# Patient Record
Sex: Female | Born: 1965 | Hispanic: No | Smoking: Never smoker
Health system: Southern US, Community
[De-identification: ages and names within clinical notes are randomized; demographics above are authoritative.]

---

## 2004-10-18 ENCOUNTER — Other Ambulatory Visit: Admission: RE | Admit: 2004-10-18 | Discharge: 2004-10-18 | Payer: Self-pay | Admitting: Obstetrics and Gynecology

## 2013-06-24 LAB — HM COLONOSCOPY

## 2014-07-03 ENCOUNTER — Ambulatory Visit (INDEPENDENT_AMBULATORY_CARE_PROVIDER_SITE_OTHER): Payer: 59 | Admitting: Medical

## 2014-07-03 ENCOUNTER — Encounter: Payer: Self-pay | Admitting: Medical

## 2014-07-03 VITALS — BP 104/60 | HR 72 | Temp 98.0°F | Resp 16 | Wt 178.0 lb

## 2014-07-03 DIAGNOSIS — J069 Acute upper respiratory infection, unspecified: Secondary | ICD-10-CM

## 2014-07-03 DIAGNOSIS — H9202 Otalgia, left ear: Secondary | ICD-10-CM

## 2014-07-03 DIAGNOSIS — H6500 Acute serous otitis media, unspecified ear: Secondary | ICD-10-CM

## 2014-07-03 MED ORDER — AZITHROMYCIN 250 MG PO TABS: ORAL_TABLET | ORAL | Status: DC

## 2014-07-03 NOTE — Progress Notes (Signed)
Subjective:  Yolanda Wilkinson is a 49 y.o. female who presents for possible sinus infection.  Symptoms include about a week hx/o head congestion, some sinus pressure, cough, fluid in ears, scratchy throat.  always starts with sore throat, and then goes to head.  Denies fever, NVD, teeth pain, wheezing, SOB.  Patient is a non-smoker.  Using allegra D for symptoms.  +lots of sick contacts.  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  She notes similar thing a year ago.  No other aggravating or relieving factors.  No other c/o.  ROS as in subjective   Objective: Filed Vitals:   07/03/14 0925  BP: 104/60  Pulse: 72  Temp: 98 F (36.7 C)  Resp: 16    General appearance: Alert, WD/WN, no distress                             Skin: warm, no rash                           Head: no sinus tenderness,                            Eyes: conjunctiva normal, corneas clear, PERRLA                            Ears: left TM with serous fluid and erythema, right tm normal,  external ear canals normal                          Nose: septum midline, turbinates swollen, with erythema and clear discharge             Mouth/throat: MMM, tongue normal, mild pharyngeal erythema                           Neck: supple, no adenopathy, no thyromegaly, nontender                          Heart: RRR, normal S1, S2, no murmurs                         Lungs: CTA bilaterally, no wheezes, rales, or rhonchi      Assessment and Plan:  Encounter Diagnoses  Name Primary?  . Acute upper respiratory infection Yes  . Otalgia of left ear   . Acute serous otitis media, recurrence not specified, unspecified laterality    Discussed exam findings, concerns, treatment recommendations.  Prescription given for Zpak.      Specific recommendations today include:  Continue Allegra D   Increase water intake  Rest  Take OTC Ibuprofen, 3 tablets up to 3 time daily for pain  Consider nasal saline flush  Begin Zpak for ear  infection  If worse or not improving within the next few days, then let me know  Consider returning at your convenience for a routine physical

## 2014-07-03 NOTE — Patient Instructions (Signed)
  Thank you for giving me the opportunity to serve you today.    Your diagnosis today includes: Encounter Diagnoses  Name Primary?  . Acute upper respiratory infection Yes  . Otalgia of left ear   . Acute serous otitis media, recurrence not specified, unspecified laterality      Specific recommendations today include:  Continue Allegra D   Increase water intake  Rest  Take OTC Ibuprofen, 3 tablets up to 3 time daily for pain  Consider nasal saline flush  Begin Zpak for ear infection  If worse or not improving within the next few days, then let me know  Consider returning at your convenience for a routine physical

## 2014-07-08 ENCOUNTER — Ambulatory Visit: Payer: Self-pay | Admitting: Medical

## 2014-12-23 ENCOUNTER — Ambulatory Visit (INDEPENDENT_AMBULATORY_CARE_PROVIDER_SITE_OTHER): Payer: 59 | Admitting: Medical

## 2014-12-23 ENCOUNTER — Encounter: Payer: Self-pay | Admitting: Medical

## 2014-12-23 VITALS — BP 120/78 | HR 72 | Temp 98.1°F | Wt 187.0 lb

## 2014-12-23 DIAGNOSIS — G4710 Hypersomnia, unspecified: Secondary | ICD-10-CM | POA: Diagnosis not present

## 2014-12-23 DIAGNOSIS — Z8489 Family history of other specified conditions: Secondary | ICD-10-CM | POA: Diagnosis not present

## 2014-12-23 DIAGNOSIS — R5383 Other fatigue: Secondary | ICD-10-CM | POA: Diagnosis not present

## 2014-12-23 DIAGNOSIS — G478 Other sleep disorders: Secondary | ICD-10-CM

## 2014-12-23 DIAGNOSIS — Z820 Family history of epilepsy and other diseases of the nervous system: Secondary | ICD-10-CM

## 2014-12-23 DIAGNOSIS — Z8349 Family history of other endocrine, nutritional and metabolic diseases: Secondary | ICD-10-CM | POA: Diagnosis not present

## 2014-12-23 DIAGNOSIS — R0683 Snoring: Secondary | ICD-10-CM | POA: Diagnosis not present

## 2014-12-23 DIAGNOSIS — R400 Somnolence: Secondary | ICD-10-CM

## 2014-12-23 LAB — COMPREHENSIVE METABOLIC PANEL
ALK PHOS: 52 U/L (ref 39–117)
ALT: 17 U/L (ref 0–35)
AST: 16 U/L (ref 0–37)
Albumin: 4.1 g/dL (ref 3.5–5.2)
BILIRUBIN TOTAL: 0.4 mg/dL (ref 0.2–1.2)
BUN: 10 mg/dL (ref 6–23)
CALCIUM: 9 mg/dL (ref 8.4–10.5)
CO2: 24 mEq/L (ref 19–32)
CREATININE: 0.83 mg/dL (ref 0.50–1.10)
Chloride: 105 mEq/L (ref 96–112)
Glucose, Bld: 74 mg/dL (ref 70–99)
Potassium: 4.5 mEq/L (ref 3.5–5.3)
Sodium: 138 mEq/L (ref 135–145)
Total Protein: 7 g/dL (ref 6.0–8.3)

## 2014-12-23 LAB — CBC
HEMATOCRIT: 39.4 % (ref 36.0–46.0)
HEMOGLOBIN: 13.1 g/dL (ref 12.0–15.0)
MCH: 28.9 pg (ref 26.0–34.0)
MCHC: 33.2 g/dL (ref 30.0–36.0)
MCV: 87 fL (ref 78.0–100.0)
MPV: 9.4 fL (ref 8.6–12.4)
PLATELETS: 343 10*3/uL (ref 150–400)
RBC: 4.53 MIL/uL (ref 3.87–5.11)
RDW: 14.2 % (ref 11.5–15.5)
WBC: 9.6 10*3/uL (ref 4.0–10.5)

## 2014-12-23 LAB — TSH: TSH: 1.202 u[IU]/mL (ref 0.350–4.500)

## 2014-12-23 LAB — T4, FREE: Free T4: 0.94 ng/dL (ref 0.80–1.80)

## 2014-12-23 NOTE — Progress Notes (Signed)
Subjective Here for not feeling herself.  Feels exhausted all the time, even after good night sleep.   Feels edgy all the time.   Mom and sister both have thyroid issues, feel like she needs to be checked out.  Been feeling this way gradually over the last 3 months.  Has been on low dose estrogen per gynecology since last August at which time she complained of some depressed mood and anxiety.    Was on prozac and treated for a little less than a year in remote past when her kids were young.  Looking back she thinks she didn't know how to cope will feelings then, but now does.  Doesn't think depression is the main cause of her symptoms currently but does feel depressed in mood, but feels somewhat guilty for not doing housework when she feels so fatigued.    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   LMP last month, but was off a little when she started the estrogen.    Periods were very painful prior.  Was not having decrease or absence of periods leading to starting estrogen this past year. No mention by gyn of menopausal.  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   She is also back in school to complete degree in music.  Has had some weight gain.   She does snore.  No witnessed apnea.     Mom and sister both have thyroid disease and had similar symptom when they were diagnosed.   Her mom and son both have sleep apnea, on CPAP.   She is exercising, eating healthy.   No other aggravating or relieving factors. No other complaint.  ROS as in subjective   Objective: BP 120/78 mmHg  Pulse 72  Temp(Src) 98.1 F (36.7 C) (Oral)  Wt 187 lb (84.823 kg)   General appearance: alert, no distress, WD/WN, white female HEENT: normocephalic, sclerae anicteric, PERRLA, EOMi, nares patent, no discharge or erythema, pharynx normal Oral cavity: MMM, no lesions Neck: supple, no lymphadenopathy, no thyromegaly, no masses Heart: RRR, normal S1, S2, no murmurs Lungs:  CTA bilaterally, no wheezes, rhonchi, or rales Extremities: no edema, no cyanosis, no clubbing Pulses: 2+ symmetric, upper and lower extremities, normal cap refill Neurological: alert, oriented x 3, CN2-12 intact, strength normal upper extremities and lower extremities, sensation normal throughout, DTRs 2+ throughout, no cerebellar signs, gait normal Psychiatric: normal affect, tearful at times, but otherwise behavior normal, pleasant    Assessment: Encounter Diagnoses  Name Primary?  . Other fatigue Yes  . Family history of thyroid disease   . Family history of sleep apnea   . Snoring   . Non-restorative sleep   . Daytime somnolence     Plan: Discussed possible causes of her symptoms, depression, perimenopause, sleep apnea, other.    Epworth sleep scale score of 7.   Neck circumference of 14".   PHQ9 score of 17  Discussed eval to include labs today, consider sleep study, consider counseling, consider depression as cause too.  F/u  pending labs.

## 2016-01-11 ENCOUNTER — Encounter: Payer: Self-pay | Admitting: Medical

## 2016-01-11 ENCOUNTER — Ambulatory Visit (INDEPENDENT_AMBULATORY_CARE_PROVIDER_SITE_OTHER): Payer: BLUE CROSS/BLUE SHIELD | Admitting: Medical

## 2016-01-11 ENCOUNTER — Ambulatory Visit
Admission: RE | Admit: 2016-01-11 | Discharge: 2016-01-11 | Disposition: A | Discharge status: Home / Self Care | Payer: Self-pay | Source: Ambulatory Visit | Attending: Medical | Admitting: Medical

## 2016-01-11 VITALS — BP 118/70 | HR 63 | Wt 170.0 lb

## 2016-01-11 DIAGNOSIS — M25562 Pain in left knee: Secondary | ICD-10-CM

## 2016-01-11 DIAGNOSIS — M7521 Bicipital tendinitis, right shoulder: Secondary | ICD-10-CM

## 2016-01-11 DIAGNOSIS — M7551 Bursitis of right shoulder: Secondary | ICD-10-CM

## 2016-01-11 DIAGNOSIS — M25511 Pain in right shoulder: Secondary | ICD-10-CM | POA: Diagnosis not present

## 2016-01-11 MED ORDER — NAPROXEN 500 MG PO TABS: 500.0000 mg | ORAL_TABLET | Freq: Two times a day (BID) | ORAL | 0 refills | Status: DC

## 2016-01-11 NOTE — Progress Notes (Signed)
Subjective:     Patient ID: Yolanda Wilkinson, female   DOB: Nov 16, 1965, 50 y.o.   MRN: [id number]  HPI  Chief Complaint  Patient presents with  . Other    hurt lt knee last week. having trouble walking. also having rt shoulder pain said that it is just a nagging pain   Here for left knee pain, right shoulder pain.  Started going to gym in January 2017, been doing boot camp class and groups weight lifting class.  goes to [employer].  Over the last several months has had ongoing right shoulder and left knee pain.   However, here today for worsening left knee pain.   Right knee is ok.   Thinks she over worked the left knee last week.  Last week did weigh lifting group class.  Last week had wet feet, walked into her bathroom on tile floor slipped and twisted knee some thing.   Left knee hurts worse in recent days.  Has had some intermittent left knee swelling, but none currently.  Knee hurts currently.   No numbness or tingling in the leg.   No numbness, weakness or tingling in arms.   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  Is right handed.   Neck hurts some too.   Uses Aleve some, ice some.   No other injury.   No other aggravating or relieving factors. No other complaint.  Review of Systems     Objective:   Physical Exam  Gen: wd, wn nad Skin : unremarkable, no erythema or bruising of knee or shoulder Neck nontender with normal ROM Left knee with mild tenderness medial joint line, mild pain with knee flexed about 45 degrees.  otherwise left knee without swelling, normal ROM, nontender otherwise, no laxity.  Rest of leg exam unremarkable bilat Right shoulder with tenderness over right biceps origin and lateral deltoid.  Otherwise non tender.  Pain with right shoulder ROM over 90 degrees.   Pain in right shoulder with neers.  Otherwise no laxity, nontender otherwise.  Rest of bilat arm exam nontender, normal ROM, no deformity. Ext: no  edema Arms and legs neurovascularly intact     Assessment:     Encounter Diagnoses  Name Primary?  . Left knee pain Yes  . Right shoulder pain   . Biceps tendonitis, right   . Shoulder bursitis, right        Plan:     Left knee pain - possibly just inflammation from overuse.   Exam mostly noncontributory.  will send for xray.    advised no deep knee bends, lunges or other significant leg exercise currently, give some time of relative rest this week, can ice 20 minutes per time, begin Naprosyn prescription, and will f/u pending xray  Shoulder pain - discussed exam and findings suggestive of bursitis of shoulder and biceps tendonitis.   Similarly to knee, advised a week of relative rest, no overhead lifting this week, ice with bag of frozen peas per time, naprosyn, and if not improving, consider PT, ortho referral or can return with Dr. Susann Givens for biceps tendonitis injection.    Yolanda Wilkinson was seen today for other.  Diagnoses and all orders for this visit:  Left knee pain -     DG Knee Complete 4 Views Left; Future  Right shoulder pain  Biceps tendonitis, right  Shoulder bursitis, right  Other orders -     naproxen (NAPROSYN) 500  MG tablet; Take 1 tablet (500 mg total) by mouth 2 (two) times daily with a meal.

## 2017-03-17 IMAGING — CR DG KNEE COMPLETE 4+V*L*
3 series · 3 of 3 positions shown · non-contrast
Comparison: None.

CLINICAL DATA: Left knee pain for several weeks without reported
injury.

EXAM:
LEFT KNEE - COMPLETE 4+ VIEW

[w knee obl. left (1 of 2)]
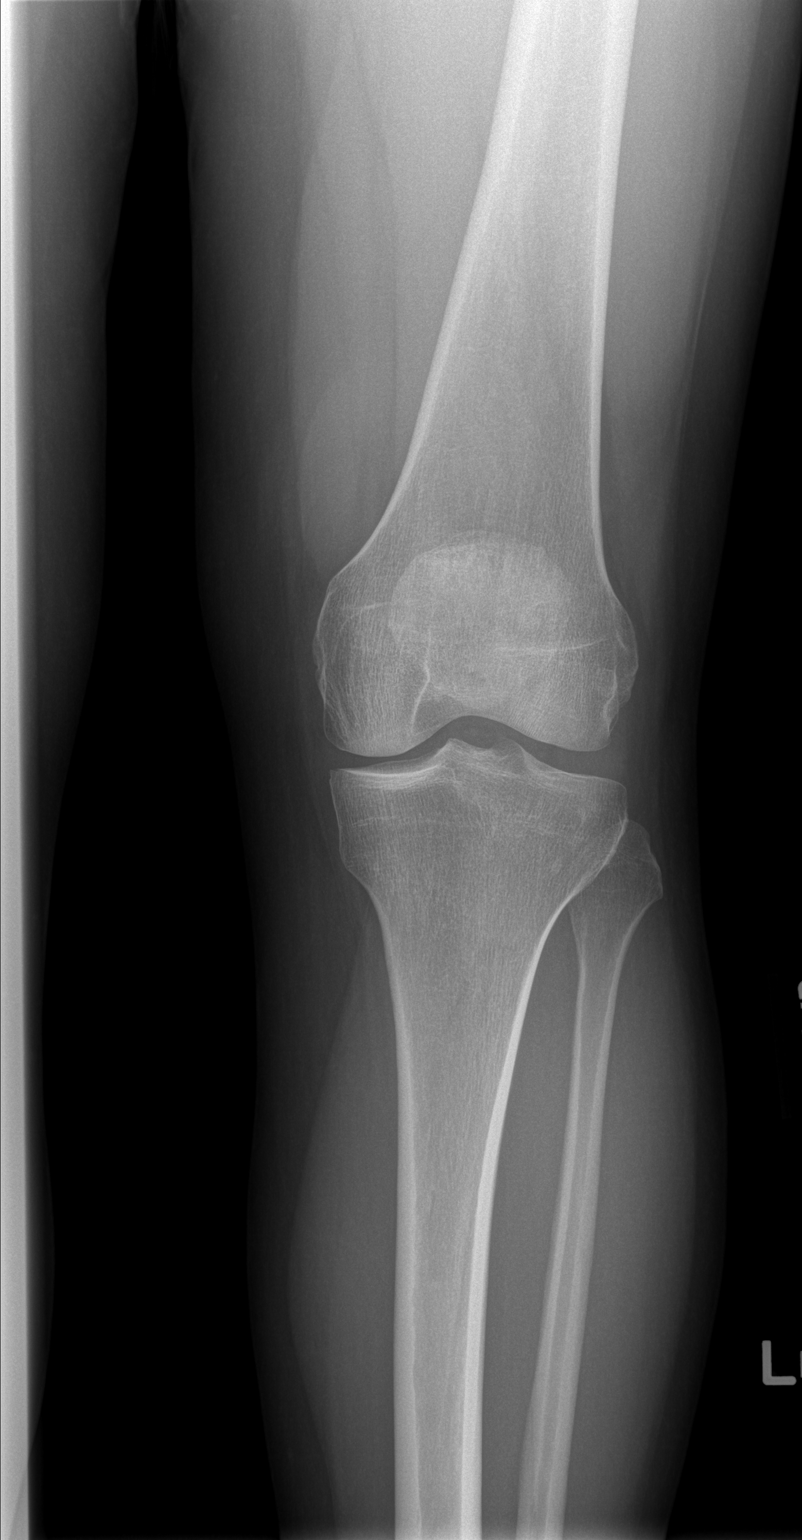

[w knee obl. left (2 of 2)]
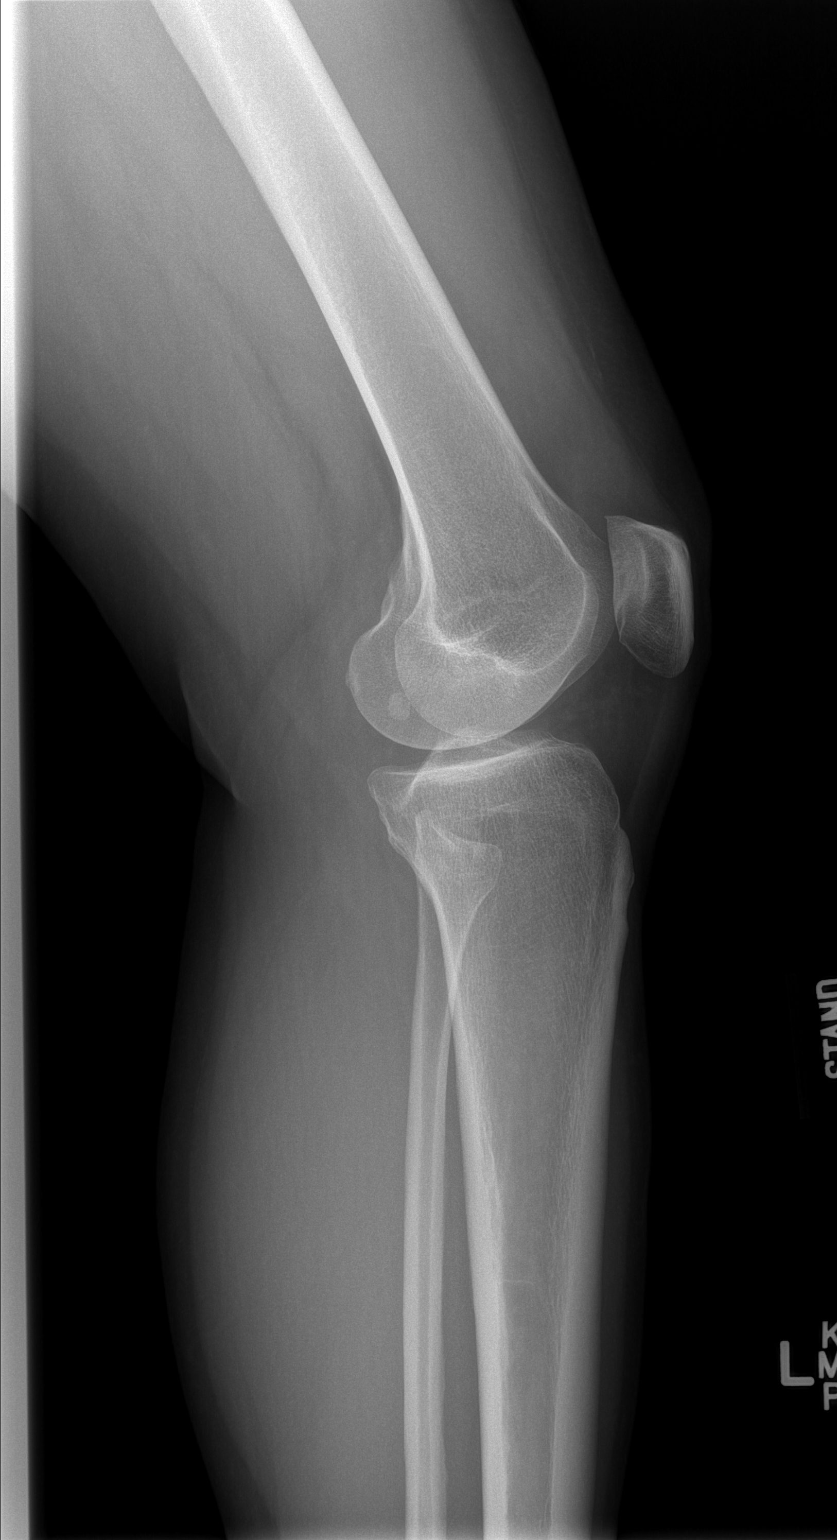

[w knee lat. left]
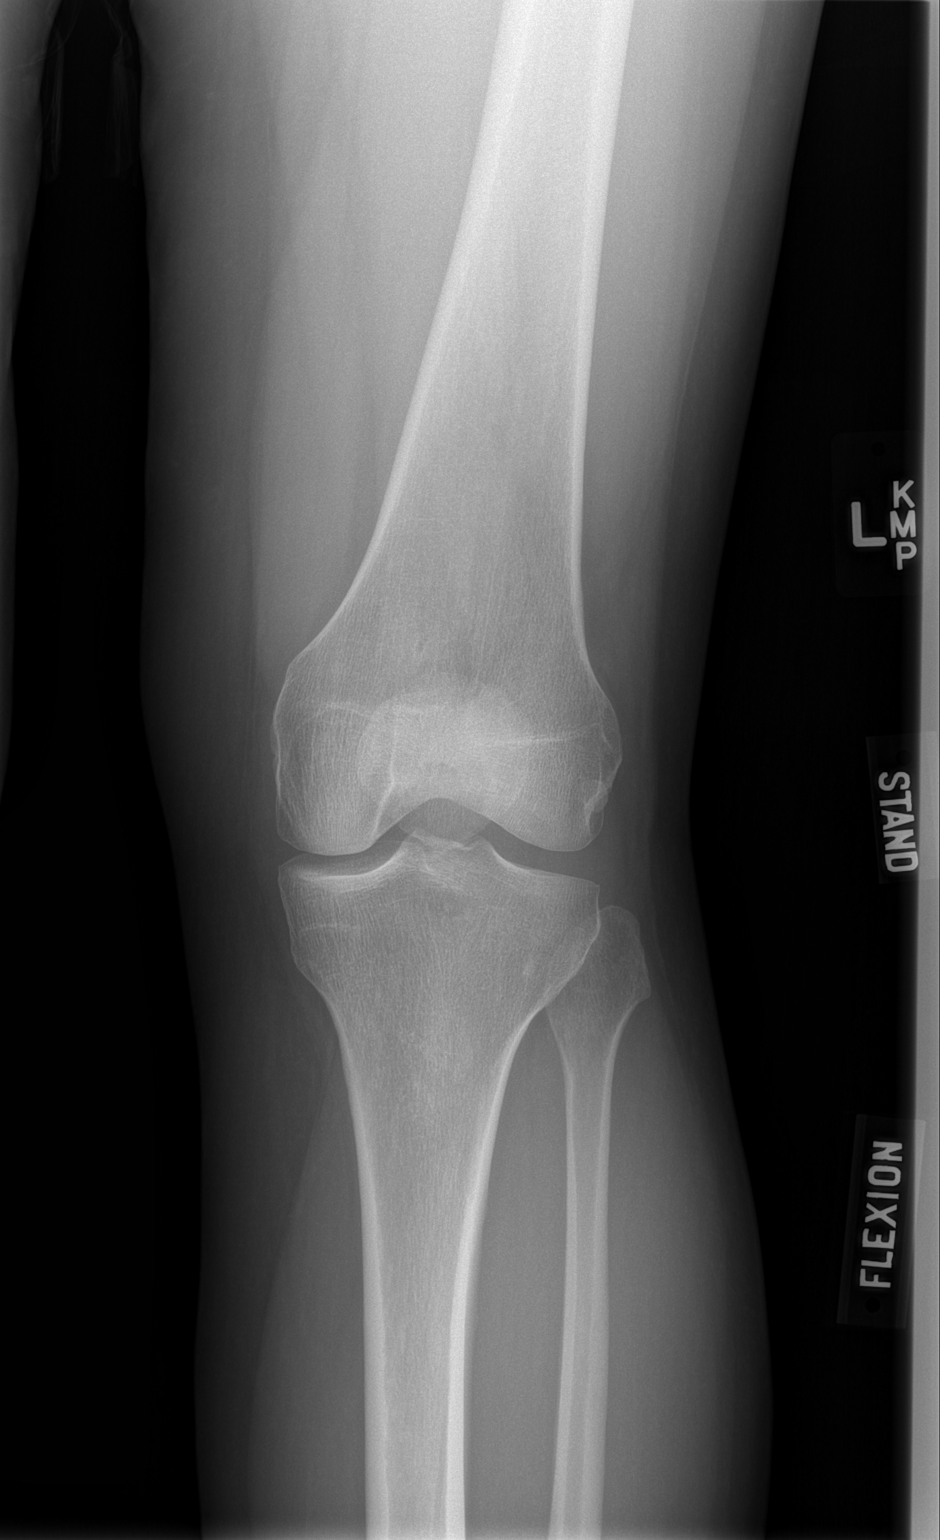

[3 of 3 positions shown; findings below may reference images not displayed]

FINDINGS: No evidence of fracture, dislocation, or joint effusion. No evidence
of arthropathy or other focal bone abnormality. Soft tissues are
unremarkable.
IMPRESSION: Normal left knee.

## 2017-07-17 ENCOUNTER — Ambulatory Visit: Payer: BLUE CROSS/BLUE SHIELD | Admitting: Family Medicine

## 2017-07-17 ENCOUNTER — Encounter: Payer: Self-pay | Admitting: Family Medicine

## 2017-07-17 VITALS — BP 110/70 | HR 68 | Temp 98.2°F | Ht 66.0 in | Wt 169.2 lb

## 2017-07-17 DIAGNOSIS — J069 Acute upper respiratory infection, unspecified: Secondary | ICD-10-CM

## 2017-07-17 DIAGNOSIS — J029 Acute pharyngitis, unspecified: Secondary | ICD-10-CM

## 2017-07-17 LAB — POCT RAPID STREP A (OFFICE): RAPID STREP A SCREEN: NEGATIVE

## 2017-07-17 NOTE — Progress Notes (Signed)
Chief Complaint  Patient presents with  . Sore Throat    started on Saturday, no fever. Has had some contact with known strep and would like to be checked today.    2 days ago she woke up with a left-sided sore throat, mild throughout the day.  By yesterday morning the throat hurt on both sides. By the time she got home from church she felt like someone hit her with a Conservation officer, nature[employer]ack truck.  She had pain in her neck, head, and some into her chest and upper back, somewhat "everywhere".  +fatigue.   +strep exposure (sister-in-law).  Didn't get a flu shot this year (declines) Denies fevers.  Daughter and her 2 babies are at home with her.  One has been coughing  PMH ,PSH, SH reviewed  Outpatient Encounter Medications as of 07/17/2017  Medication Sig  . busPIRone (BUSPAR) 10 MG tablet BID  . vitamin C (ASCORBIC ACID) 500 MG tablet Take 500 mg by mouth daily.  . [DISCONTINUED] naproxen (NAPROSYN) 500 MG tablet Take 1 tablet (500 mg total) by mouth 2 (two) times daily with a meal.  . [DISCONTINUED] norethindrone-ethinyl estradiol (JINTELI) 1-5 MG-MCG TABS Take by mouth daily.   No facility-administered encounter medications on file as of 07/17/2017.    Allergies  Allergen Reactions  . Augmentin [Amoxicillin-Pot Clavulanate] Hives    Can take Penicillin and Amoxicillin   ROS:  No fever, chills, dizziness, syncope, chest pain, palpitations, shortness of breath, nausea, vomiting, diarrhea, bowel changes, bleeding, bruising, rash. She has some chronic postnasal drainage. See HPI  PHYSICAL EXAM:  BP 110/70   Pulse 68   Temp 98.2 F (36.8 C) (Tympanic)   Ht 5\' 6"  (1.676 m)   Wt 169 lb 3.2 oz (76.7 kg)   LMP 07/12/2017 (Exact Date)   BMI 27.31 kg/m   Well appearing, pleasant female in no distress. Some throat clearing during visit HEENT: PERRL, EOMI, conjunctiva and sclera are clear. TM's and EAC's normal.  Nasal mucosa is moderately-severely edematous, L>R, no purulent drainage. Sinuses are  nontender. Throat:  Slight white spot on normal appearing tonsil on the left (n crypt).  The tonsil is not enlarged, no erythema, symmetric.  Neck: no lymphadenopathy or mass Heart: regular rate and rhythm Lungs: clear bilaterally Extremities: no edema Skin: normal turgor, no rash Psych: normal mood, affect, hygiene and grooming Neuro: alert and oriented, cranial nerves intact, normal gait  Rapid strep negative   ASSESSMENT/PLAN:  Viral upper respiratory infection - supportive measures reviewed, s/sx bacterial infection  Sore throat - Plan: Rapid Strep A     Drink plenty of water, and get some rest. Take a decongestant such as sudafed to help with sinus pressure, and dry up any drainage. Use Mucinex 12 hour (plain) twice daily to help loosen the mucus. You may use tylenol and/or ibuprofen or Aleve as needed for fever or pain/aches Salt water gargles may help with your throat pain, as well as Chloraseptic. Return if you develop fever, discolored mucus, worsening symptoms.

## 2017-07-17 NOTE — Patient Instructions (Signed)
  Drink plenty of water, and get some rest. Take a decongestant such as sudafed to help with sinus pressure, and dry up any drainage. Use Mucinex 12 hour (plain) twice daily to help loosen the mucus. You may use tylenol and/or ibuprofen or Aleve as needed for fever or pain/aches Salt water gargles may help with your throat pain, as well as Chloraseptic. Return if you develop fever, discolored mucus, worsening symptoms.

## 2018-05-11 ENCOUNTER — Ambulatory Visit: Payer: BLUE CROSS/BLUE SHIELD | Admitting: Medical

## 2018-05-11 ENCOUNTER — Encounter: Payer: Self-pay | Admitting: Medical

## 2018-05-11 VITALS — BP 118/80 | HR 73 | Temp 98.2°F | Wt 174.6 lb

## 2018-05-11 DIAGNOSIS — R0982 Postnasal drip: Secondary | ICD-10-CM

## 2018-05-11 DIAGNOSIS — R053 Chronic cough: Secondary | ICD-10-CM

## 2018-05-11 DIAGNOSIS — R05 Cough: Secondary | ICD-10-CM

## 2018-05-11 DIAGNOSIS — R29898 Other symptoms and signs involving the musculoskeletal system: Secondary | ICD-10-CM

## 2018-05-11 DIAGNOSIS — M7521 Bicipital tendinitis, right shoulder: Secondary | ICD-10-CM | POA: Diagnosis not present

## 2018-05-11 MED ORDER — PREDNISONE 10 MG PO TABS: ORAL_TABLET | ORAL | 0 refills | Status: DC

## 2018-05-11 MED ORDER — BENZONATATE 100 MG PO CAPS: 100.0000 mg | ORAL_CAPSULE | Freq: Two times a day (BID) | ORAL | 0 refills | Status: DC | PRN

## 2018-05-11 MED ORDER — DEXLANSOPRAZOLE 60 MG PO CPDR: 60.0000 mg | DELAYED_RELEASE_CAPSULE | Freq: Every day | ORAL | 0 refills | Status: DC

## 2018-05-11 NOTE — Progress Notes (Signed)
Subjective: Chief Complaint  Patient presents with  . other    sinus issues and cough for about three months. right shoulder and left knee issues    Here for 3 months had bad cough and illness.  Got better, but still has nagging cough.  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  Always seems to have drainage down throat.   Cough keeps her throat irritated.   Using allegra D and flonase for the past 1+ week.   It helps some.  She has year round drainage.  Normally doesn't take year round antihistamine.  Saw ENT years ago but didn't seem to get resolution of symptoms.  No fever.  No night sweats.   Nonsmoker.   Sometimes gets heartburn and acid reflux.   Doesn't eat a lot of GERD triggers, but does eat some.  Ate chili last week and got some heartburn.   Not much alcohol.   Does sometimes eat close to bedtime.  Is suppose to sing this weekend, so needs to be better.   Right shoulder - pain for 2-3 weeks.  No injury, no trauma, but baby sitting grandkids and the baby is heavy.   Having left knee crunchy feeling, but no pain, no swelling.    No injury, no trauma  No other aggravating or relieving factors. No other complaint.  No past medical history on file.   Current Outpatient Medications on File Prior to Visit  Medication Sig Dispense Refill  . Fexofenadine-Pseudoephedrine (ALLEGRA-D 12 HOUR PO) Take by mouth.    . fluticasone (FLONASE) 50 MCG/ACT nasal spray Place into both nostrils daily.    . Naproxen Sodium (ALEVE PO) Take by mouth.    . busPIRone (BUSPAR) 10 MG tablet BID    . vitamin C (ASCORBIC ACID) 500 MG tablet Take 500 mg by mouth daily.     No current facility-administered medications on file prior to visit.   No past surgical history on file.   Objective: BP 118/80 (BP Location: Left Arm, Patient Position: Sitting)   Pulse 73   Temp 98.2 F (36.8 C)   Wt 174 lb 9.6 oz (79.2 kg)   SpO2 98%   BMI 28.18 kg/m   General  appearance: alert, no distress, WD/WN,  HEENT: normocephalic, sclerae anicteric, TMs pearly, nares patent, no discharge or erythema, pharynx with mild erythema Oral cavity: MMM, no lesions Neck: supple, no lymphadenopathy, no thyromegaly, no masses lungs: CTA bilaterally, no wheezes, rhonchi, or rales Pulses: 2+ symmetric, upper and lower extremities, normal cap refill She is mildly tender of the right biceps origin, otherwise shoulder nontender, normal range of motion, no laxity, mild pain with step-off test with internal range of motion, otherwise arms neurovascularly intact without pain or deformity Left knee with no obvious deformity or laxity or swelling, nontender, there is a slight pop felt with flexion but that is symmetric on both sides left and right knee, otherwise leg exam unremarkable    Assessment: Encounter Diagnoses  Name Primary?  . Chronic cough Yes  . Popping sound of knee joint   . Biceps tendinitis of right upper extremity   . Post-nasal drainage      Plan: We discussed her several symptoms.  Chronic cough-likely a combination of post viral syndrome cough but also acid reflux could be playing a role.  No major concern for other worrisome findings.  She declines chest x-ray today.  We discussed recommendations including antireflux measures, begin samples  of Dexilant, Tessalon Perles for cough suppression, and prednisone taper.  If not improving in the next 4 to 5 days then call back.  Shoulder pain, right bicep tendinitis-discussed supportive care, relative rest, ice, and if not much improved within a week call or return.  Discussed her knee popping sound, normal exam, advise she start back on a regular exercise routine including stretching and aerobic and weightbearing exercise.   Patient Instructions  Recommendations  Begin Dexilant reflux prescription samples 1 daily for 10 days  Any time you feel heartburn, indigestion, fullness in upper belly, a lot of  belching, then use antacid OTC   Limit or cut back on acid reflux triggers like peppers, hot sauce, spicy foods, citrus, and tomato based foods  Avoid eating within 1 hour of bedtime  Begin Tesslon Perles cough drop up to 3 times daily as needed  Begin Prednisone taper for the next 6 days  Consider hot tea, lemon and honey a few times daily for the next few days to help clear mucous from the back of the throat  Continue Allegra for the time being  If no improvement within 3-4 weeks, call back and lets consider chest xray    Using Saline Nose Drops with Bulb Syringe A bulb syringe is used to clear your nose. You may use it when you have a stuffy nose, nasal congestion, sinus pressure, or sneezing.   SALINE SOLUTION You can buy nose drops at your local drug store. You can also make nose drops yourself. Mix 1 cup of water with  teaspoon of salt. Stir. Store this mixture at room temperature. Make a new batch daily.  USE THE BULB IN COMBINATION WITH SALINE NOSE DROPS  Squeeze the air out of the bulb before suctioning the saline mixture.  While still squeezing the bulb flat, place the tip of the bulb into the saline mixture.  Let air come back into the bulb.  This will suction up the saline mixture.  Gently flush one nostril at a time.  Salt water nose drops will then moisten your  congested nose and loosen secretions before suctioning.  Use the bulb syringe as directed below to suction.  USING THE BULB SYRINGE TO SUCTION  While still squeezing the bulb flat, place the tip of the bulb into a nostril. Let air come back into the bulb. The suction will pull snot out of the nose and into the bulb.  Repeat on the other nostril.  Squeeze syringe several times into a tissue.  CLEANING THE BULB SYRINGE  Clean the bulb syringe every day with hot soapy water.  Clean the inside of the bulb by squeezing the bulb while the tip is in soapy water.  Rinse by squeezing the bulb while the  tip is in clean hot water.  Store the bulb with the tip side down on paper towel.  HOME CARE INSTRUCTIONS   Use saline nose drops often to keep the nose open and not stuffy.  Throw away used salt water. Make a new solution every time.  Do not use the same solution and dropper for another person  If you do not prefer to use nasal saline flush, other options include nasal saline spray or the EchoStar, both of which are available over the counter at your pharmacy.      Follow-up soon for a preventive care visit/physical

## 2018-05-11 NOTE — Patient Instructions (Signed)
Recommendations  Begin Dexilant reflux prescription samples 1 daily for 10 days  Any time you feel heartburn, indigestion, fullness in upper belly, a lot of belching, then use antacid OTC   Limit or cut back on acid reflux triggers like peppers, hot sauce, spicy foods, citrus, and tomato based foods  Avoid eating within 1 hour of bedtime  Begin Tesslon Perles cough drop up to 3 times daily as needed  Begin Prednisone taper for the next 6 days  Consider hot tea, lemon and honey a few times daily for the next few days to help clear mucous from the back of the throat  Continue Allegra for the time being  If no improvement within 3-4 weeks, call back and lets consider chest xray    Using Saline Nose Drops with Bulb Syringe A bulb syringe is used to clear your nose. You may use it when you have a stuffy nose, nasal congestion, sinus pressure, or sneezing.   SALINE SOLUTION You can buy nose drops at your local drug store. You can also make nose drops yourself. Mix 1 cup of water with  teaspoon of salt. Stir. Store this mixture at room temperature. Make a new batch daily.  USE THE BULB IN COMBINATION WITH SALINE NOSE DROPS  Squeeze the air out of the bulb before suctioning the saline mixture.  While still squeezing the bulb flat, place the tip of the bulb into the saline mixture.  Let air come back into the bulb.  This will suction up the saline mixture.  Gently flush one nostril at a time.  Salt water nose drops will then moisten your  congested nose and loosen secretions before suctioning.  Use the bulb syringe as directed below to suction.  USING THE BULB SYRINGE TO SUCTION  While still squeezing the bulb flat, place the tip of the bulb into a nostril. Let air come back into the bulb. The suction will pull snot out of the nose and into the bulb.  Repeat on the other nostril.  Squeeze syringe several times into a tissue.  CLEANING THE BULB SYRINGE  Clean the bulb syringe  every day with hot soapy water.  Clean the inside of the bulb by squeezing the bulb while the tip is in soapy water.  Rinse by squeezing the bulb while the tip is in clean hot water.  Store the bulb with the tip side down on paper towel.  HOME CARE INSTRUCTIONS   Use saline nose drops often to keep the nose open and not stuffy.  Throw away used salt water. Make a new solution every time.  Do not use the same solution and dropper for another person  If you do not prefer to use nasal saline flush, other options include nasal saline spray or the EchoStareti Pot, both of which are available over the counter at your pharmacy.

## 2018-07-20 ENCOUNTER — Other Ambulatory Visit: Payer: Self-pay | Admitting: Medical

## 2018-07-20 ENCOUNTER — Telehealth: Payer: Self-pay | Admitting: Internal Medicine

## 2018-07-20 ENCOUNTER — Ambulatory Visit: Payer: BLUE CROSS/BLUE SHIELD | Admitting: Family Medicine

## 2018-07-20 ENCOUNTER — Encounter: Payer: Self-pay | Admitting: Family Medicine

## 2018-07-20 VITALS — BP 110/72 | HR 87 | Temp 98.5°F | Resp 16 | Wt 172.8 lb

## 2018-07-20 DIAGNOSIS — J0140 Acute pansinusitis, unspecified: Secondary | ICD-10-CM | POA: Diagnosis not present

## 2018-07-20 MED ORDER — DEXLANSOPRAZOLE 60 MG PO CPDR: 60.0000 mg | DELAYED_RELEASE_CAPSULE | Freq: Every day | ORAL | 0 refills | Status: DC

## 2018-07-20 MED ORDER — AMOXICILLIN 875 MG PO TABS: 875.0000 mg | ORAL_TABLET | Freq: Two times a day (BID) | ORAL | 0 refills | Status: DC

## 2018-07-20 MED ORDER — DEXLANSOPRAZOLE 60 MG PO CPDR: 60.0000 mg | DELAYED_RELEASE_CAPSULE | Freq: Every day | ORAL | 2 refills | Status: DC

## 2018-07-20 NOTE — Telephone Encounter (Signed)
Thinks she has colonoscopy in 2015 at [facility] she thinks is the location

## 2018-07-20 NOTE — Telephone Encounter (Signed)
Pt would like dexilant or a rx for delixant cause when she was here back in November you gave her samples and they worked will for her. She would like to take it for flare ups. Please advise. Pt is seeing vickie

## 2018-07-20 NOTE — Addendum Note (Signed)
Addended by: Herminio Commons A on: 07/20/2018 04:38 PM   Modules accepted: Orders

## 2018-07-20 NOTE — Telephone Encounter (Signed)
dexilant script sent.  Lets try to get a copy of the colonosocpy

## 2018-07-20 NOTE — Patient Instructions (Signed)
Take the amoxicillin as prescribed.  Stay well-hydrated.  You can take Tylenol or ibuprofen as well.  You may want to try a steroid nasal spray such as Nasacort or Flonase.  You could also try Sudafed for the nasal congestion.  If you are not back to baseline after completing the antibiotic, let me know.

## 2018-07-20 NOTE — Progress Notes (Signed)
Chief Complaint  Patient presents with  . sinus infection    sinus infection since sunday, congestion, coughing, sinus pressure, headache    Subjective:  Yolanda Wilkinson is a 53 y.o. female who presents for a 6 day history of nasal congestion, thick purulent nasal discharge, post nasal drainage, frontal headache, ear pressure, mild cough. Reports feeling like sinus infections in the past.  No recent antibiotics.  Does not smoke.   Denies fever, chills, chest pain, palpitations, shortness of breath, wheezing, abdominal pain, N/V/D.   Treatment to date: none.  Denies sick contacts.  No other aggravating or relieving factors.  No other c/o.  ROS as in subjective.   Objective: Vitals:   07/20/18 1517  BP: 110/72  Pulse: 87  Resp: 16  Temp: 98.5 F (36.9 C)  SpO2: 98%    General appearance: Alert, WD/WN, no distress, mildly ill appearing                             Skin: warm, no rash                           Head: + Frontal and maxillary sinus tenderness                            Eyes: conjunctiva normal, corneas clear, PERRLA                            Ears: pearly TMs, external ear canals normal                          Nose: septum midline, turbinates swollen, with erythema and thick discharge             Mouth/throat: MMM, tongue normal, mild pharyngeal erythema, no edema or exudate                           Neck: supple, no adenopathy, no thyromegaly, nontender                          Heart: RRR, normal S1, S2, no murmurs                         Lungs: CTA bilaterally, no wheezes, rales, or rhonchi      Assessment: Acute non-recurrent pansinusitis - Plan: amoxicillin (AMOXIL) 875 MG tablet    Plan: Discussed diagnosis and treatment of acute sinusitis. Yolanda Wilkinson insists Yolanda Wilkinson can take Amoxil without issues but cannot take Clavulanate.  Suggested symptomatic OTC remedies.Nasal saline spray for congestion.  Yolanda Wilkinson may also try a steroid nasal spray and Sudafed for severe nasal  congestion.  Tylenol or Ibuprofen OTC for fever and malaise.  Call/return if worsening or not back to baseline after completing the antibiotic.

## 2018-07-21 ENCOUNTER — Other Ambulatory Visit: Payer: Self-pay | Admitting: Family Medicine

## 2018-07-21 MED ORDER — CLARITHROMYCIN 500 MG PO TABS: 500.0000 mg | ORAL_TABLET | Freq: Two times a day (BID) | ORAL | 0 refills | Status: AC

## 2018-07-21 NOTE — Progress Notes (Signed)
She was apparently given amoxicillin and has an allergy to penicillin.  The rash is mainly on her arms.  She was instructed to use Benadryl and if the rash gets worse, has trouble breathing or notes swelling in the mouth and tongue, she should go the ER.  I will switch her to Biaxin.

## 2018-07-23 NOTE — Telephone Encounter (Signed)
Requested records. And notified pt that script was sent to pharmacy

## 2018-08-10 ENCOUNTER — Telehealth: Payer: Self-pay | Admitting: Medical

## 2018-08-10 NOTE — Telephone Encounter (Signed)
P.A. DEXILANT 

## 2018-08-18 NOTE — Telephone Encounter (Signed)
P.A. denied, pt needs trial of Lansoprazole, Esomeprazole or Pantoprazole, do you want to switch?

## 2018-08-20 ENCOUNTER — Other Ambulatory Visit: Payer: Self-pay | Admitting: Medical

## 2018-08-20 MED ORDER — PANTOPRAZOLE SODIUM 40 MG PO TBEC: DELAYED_RELEASE_TABLET | ORAL | 0 refills | Status: AC

## 2018-08-20 NOTE — Telephone Encounter (Signed)
Let her know about the insurance prior Auth and I am going to change her to Protonix or pantoprazole.  Get her in for physical and recheck as her last labs were 4 years ago

## 2018-08-21 NOTE — Telephone Encounter (Signed)
Pt informed and scheduled CPE

## 2018-09-26 ENCOUNTER — Encounter: Payer: BLUE CROSS/BLUE SHIELD | Admitting: Medical

## 2018-10-12 ENCOUNTER — Telehealth: Payer: Self-pay | Admitting: Medical

## 2018-10-12 NOTE — Telephone Encounter (Signed)
Received requested records from [facility]. Sending back for review.  °
